# Patient Record
Sex: Male | Born: 1965 | Race: White | Hispanic: No | Marital: Single | Smoking: Current every day smoker
Health system: Southern US, Community
[De-identification: ages and names within clinical notes are randomized; demographics above are authoritative.]

## PROBLEM LIST (undated history)

## (undated) HISTORY — PX: VASECTOMY: SHX75

## (undated) HISTORY — PX: HERNIA REPAIR: SHX51

## (undated) HISTORY — PX: FRACTURE SURGERY: SHX138

---

## 2009-06-20 DEATH — deceased

## 2014-03-20 ENCOUNTER — Emergency Department (HOSPITAL_BASED_OUTPATIENT_CLINIC_OR_DEPARTMENT_OTHER)
Admission: EM | Admit: 2014-03-20 | Discharge: 2014-03-20 | Disposition: A | Discharge status: Home / Self Care | Attending: Emergency Medicine | Admitting: Emergency Medicine

## 2014-03-20 ENCOUNTER — Encounter (HOSPITAL_BASED_OUTPATIENT_CLINIC_OR_DEPARTMENT_OTHER): Payer: Self-pay | Admitting: *Deleted

## 2014-03-20 ENCOUNTER — Emergency Department (HOSPITAL_BASED_OUTPATIENT_CLINIC_OR_DEPARTMENT_OTHER)

## 2014-03-20 DIAGNOSIS — Y9289 Other specified places as the place of occurrence of the external cause: Secondary | ICD-10-CM | POA: Insufficient documentation

## 2014-03-20 DIAGNOSIS — Y990 Civilian activity done for income or pay: Secondary | ICD-10-CM | POA: Diagnosis not present

## 2014-03-20 DIAGNOSIS — Z720 Tobacco use: Secondary | ICD-10-CM | POA: Insufficient documentation

## 2014-03-20 DIAGNOSIS — Y9389 Activity, other specified: Secondary | ICD-10-CM | POA: Insufficient documentation

## 2014-03-20 DIAGNOSIS — W231XXA Caught, crushed, jammed, or pinched between stationary objects, initial encounter: Secondary | ICD-10-CM | POA: Insufficient documentation

## 2014-03-20 DIAGNOSIS — S99922A Unspecified injury of left foot, initial encounter: Secondary | ICD-10-CM | POA: Diagnosis present

## 2014-03-20 DIAGNOSIS — S93602A Unspecified sprain of left foot, initial encounter: Secondary | ICD-10-CM | POA: Diagnosis not present

## 2014-03-20 NOTE — ED Provider Notes (Signed)
CSN: [id number]     Arrival date & time 03/20/14  1217 History   First MD Initiated Contact with Patient 03/20/14 1239     Chief Complaint  Patient presents with  . Foot Injury     (Consider location/radiation/quality/duration/timing/severity/associated sxs/prior Treatment) HPI Comments: 49 year old male presenting to the ED complaining of left foot pain 1 week. Patient reports one week ago while he was at work his foot got compressed by a pallet jack. Patient states initially his foot was not very painful, however throughout the night into the next day his foot started to throb. Pain worse when he walks on the ball of his foot. 2 days later, he noticed across the top of his foot was bruised, with some swelling. The bruising and swelling went away, and then he noticed some redness across the top of his toes which made him come in to the ED today. He took Aleve the first night with some relief of his symptoms. Denies fever or chills. There have been no open wounds. Pt states he stepped on a sewing pin when he was 12 and it was never removed and may be seen on xray.  Patient is a 49 y.o. male presenting with foot injury. The history is provided by the patient.  Foot Injury Associated symptoms: no fever     History reviewed. No pertinent past medical history. Past Surgical History  Procedure Laterality Date  . Hernia repair    . Vasectomy    . Fracture surgery     No family history on file. History  Substance Use Topics  . Smoking status: Current Every Day Smoker -- 0.50 packs/day    Types: Cigarettes  . Smokeless tobacco: Not on file  . Alcohol Use: Yes    Review of Systems  Constitutional: Negative for fever.  Musculoskeletal:       + L foot pain and swelling.  Skin: Positive for color change.  Neurological: Negative for numbness.      Allergies  Review of patient's allergies indicates no known allergies.  Home Medications   Prior to Admission medications   Not on  File   BP 129/74 mmHg  Pulse 92  Temp(Src) 97.9 F (36.6 C) (Oral)  Resp 18  Ht  (1.778 m)  Wt 167 lb (75.751 kg)  BMI 23.96 kg/m2  SpO2 98% Physical Exam  Constitutional: He is oriented to person, place, and time. He appears well-developed and well-nourished. No distress.  HENT:  Head: Normocephalic and atraumatic.  Eyes: Conjunctivae and EOM are normal.  Neck: Normal range of motion. Neck supple.  Cardiovascular: Normal rate, regular rhythm and normal heart sounds.   Pulmonary/Chest: Effort normal and breath sounds normal.  Musculoskeletal: Normal range of motion.  L foot- Tender to palpation over proximal third through fifth metatarsals. No swelling. Small area of redness over second MTP. No swelling, tenderness or warmth. Normal range of motion of his foot and ankle. +2 PT/DP pulse.  Neurological: He is alert and oriented to person, place, and time.  Skin: Skin is warm and dry.  Psychiatric: He has a normal mood and affect. His behavior is normal.  Nursing note and vitals reviewed.   ED Course  Procedures (including critical care time) Labs Review Labs Reviewed - No data to display  Imaging Review Dg Foot Complete Left  03/20/2014   CLINICAL DATA:  Acute left foot pain after getting stuck between a pallet at work 1 week ago. Initial encounter.  EXAM: LEFT FOOT -  COMPLETE 3+ VIEW  COMPARISON:  None.  FINDINGS: There is no evidence of fracture or dislocation. Surgical screw is noted in the calcaneus. Joint spaces are intact. Soft tissues are unremarkable.  IMPRESSION: No acute abnormality seen in the left foot.   Electronically Signed   By: Roque LiasJames  Green M.D.   On: 03/20/2014 13:39     EKG Interpretation None      MDM   Final diagnoses:  Foot sprain, left, initial encounter   Patient presenting with left foot pain after injury. Neurovascularly intact. No swelling or deformity. No acute findings on x-ray. Advised him to rest, ice and elevate his foot. NSAIDs for  pain. Stable for discharge. Return precautions given. Patient states understanding of treatment care plan and is agreeable.   Kathrynn SpeedRobyn M Oiva Dibari, PA-C 03/20/14 [id number]  Doaa Kendzierski M Teshaun Olarte, PA-C 03/20/14 1351  Mirian MoMatthew Gentry, MD 03/24/14 [phone]

## 2014-03-20 NOTE — ED Notes (Signed)
Left foot injury a week ago. His foot was compressed by a pallet jack. It was work related. He is contacting his work to ask if UDS is required.

## 2014-03-20 NOTE — Discharge Instructions (Signed)
Foot Sprain The muscles and cord like structures which attach muscle to bone (tendons) that surround the feet are made up of units. A foot sprain can occur at the weakest spot in any of these units. This condition is most often caused by injury to or overuse of the foot, as from playing contact sports, or aggravating a previous injury, or from poor conditioning, or obesity. SYMPTOMS  Pain with movement of the foot.  Tenderness and swelling at the injury site.  Loss of strength is present in moderate or severe sprains. THE THREE GRADES OR SEVERITY OF FOOT SPRAIN ARE:  Mild (Grade I): Slightly pulled muscle without tearing of muscle or tendon fibers or loss of strength.  Moderate (Grade II): Tearing of fibers in a muscle, tendon, or at the attachment to bone, with small decrease in strength.  Severe (Grade III): Rupture of the muscle-tendon-bone attachment, with separation of fibers. Severe sprain requires surgical repair. Often repeating (chronic) sprains are caused by overuse. Sudden (acute) sprains are caused by direct injury or over-use. DIAGNOSIS  Diagnosis of this condition is usually by your own observation. If problems continue, a caregiver may be required for further evaluation and treatment. X-rays may be required to make sure there are not breaks in the bones (fractures) present. Continued problems may require physical therapy for treatment. PREVENTION  Use strength and conditioning exercises appropriate for your sport.  Warm up properly prior to working out.  Use athletic shoes that are made for the sport you are participating in.  Allow adequate time for healing. Early return to activities makes repeat injury more likely, and can lead to an unstable arthritic foot that can result in prolonged disability. Mild sprains generally heal in 3 to 10 days, with moderate and severe sprains taking 2 to 10 weeks. Your caregiver can help you determine the proper time required for  healing. HOME CARE INSTRUCTIONS   Apply ice to the injury for 15-20 minutes, 03-04 times per day. Put the ice in a plastic bag and place a towel between the bag of ice and your skin.  An elastic wrap (like an Ace bandage) may be used to keep swelling down.  Keep foot above the level of the heart, or at least raised on a footstool, when swelling and pain are present.  Try to avoid use other than gentle range of motion while the foot is painful. Do not resume use until instructed by your caregiver. Then begin use gradually, not increasing use to the point of pain. If pain does develop, decrease use and continue the above measures, gradually increasing activities that do not cause discomfort, until you gradually achieve normal use.  Use crutches if and as instructed, and for the length of time instructed.  Keep injured foot and ankle wrapped between treatments.  Massage foot and ankle for comfort and to keep swelling down. Massage from the toes up towards the knee.  Only take over-the-counter or prescription medicines for pain, discomfort, or fever as directed by your caregiver. SEEK IMMEDIATE MEDICAL CARE IF:   Your pain and swelling increase, or pain is not controlled with medications.  You have loss of feeling in your foot or your foot turns cold or blue.  You develop new, unexplained symptoms, or an increase of the symptoms that brought you to your caregiver. MAKE SURE YOU:   Understand these instructions.  Will watch your condition.  Will get help right away if you are not doing well or get worse. Document Released:   07/29/2001 Document Revised: 05/01/2011 Document Reviewed: 09/26/2007 [employer] Patient Information 2015 [employer]. This information is not intended to replace advice given to you by your health care provider. Make sure you discuss any questions you have with your health care provider.  RICE: Routine Care for Injuries The routine care of many injuries includes  Rest, Ice, Compression, and Elevation (RICE). HOME CARE INSTRUCTIONS  Rest is needed to allow your body to heal. Routine activities can usually be resumed when comfortable. Injured tendons and bones can take up to 6 weeks to heal. Tendons are the cord-like structures that attach muscle to bone.  Ice following an injury helps keep the swelling down and reduces pain.  Put ice in a plastic bag.  Place a towel between your skin and the bag.  Leave the ice on for 15-20 minutes, 3-4 times a day, or as directed by your health care provider. Do this while awake, for the first 24 to 48 hours. After that, continue as directed by your caregiver.  Compression helps keep swelling down. It also gives support and helps with discomfort. If an elastic bandage has been applied, it should be removed and reapplied every 3 to 4 hours. It should not be applied tightly, but firmly enough to keep swelling down. Watch fingers or toes for swelling, bluish discoloration, coldness, numbness, or excessive pain. If any of these problems occur, remove the bandage and reapply loosely. Contact your caregiver if these problems continue.  Elevation helps reduce swelling and decreases pain. With extremities, such as the arms, hands, legs, and feet, the injured area should be placed near or above the level of the heart, if possible. SEEK IMMEDIATE MEDICAL CARE IF:  You have persistent pain and swelling.  You develop redness, numbness, or unexpected weakness.  Your symptoms are getting worse rather than improving after several days. These symptoms may indicate that further evaluation or further X-rays are needed. Sometimes, X-rays may not show a small broken bone (fracture) until 1 week or 10 days later. Make a follow-up appointment with your caregiver. Ask when your X-ray results will be ready. Make sure you get your X-ray results. Document Released: 05/21/2000 Document Revised: 02/11/2013 Document Reviewed:  07/08/2010 [employer] Patient Information 2015 [employer]. This information is not intended to replace advice given to you by your health care provider. Make sure you discuss any questions you have with your health care provider.  

## 2015-03-30 ENCOUNTER — Encounter (HOSPITAL_BASED_OUTPATIENT_CLINIC_OR_DEPARTMENT_OTHER): Payer: Self-pay | Admitting: Emergency Medicine

## 2015-03-30 ENCOUNTER — Emergency Department (HOSPITAL_BASED_OUTPATIENT_CLINIC_OR_DEPARTMENT_OTHER)

## 2015-03-30 ENCOUNTER — Emergency Department (HOSPITAL_BASED_OUTPATIENT_CLINIC_OR_DEPARTMENT_OTHER)
Admission: EM | Admit: 2015-03-30 | Discharge: 2015-03-30 | Disposition: A | Discharge status: Home / Self Care | Attending: Emergency Medicine | Admitting: Emergency Medicine

## 2015-03-30 DIAGNOSIS — S86112A Strain of other muscle(s) and tendon(s) of posterior muscle group at lower leg level, left leg, initial encounter: Secondary | ICD-10-CM | POA: Diagnosis not present

## 2015-03-30 DIAGNOSIS — F17210 Nicotine dependence, cigarettes, uncomplicated: Secondary | ICD-10-CM | POA: Insufficient documentation

## 2015-03-30 DIAGNOSIS — Y9289 Other specified places as the place of occurrence of the external cause: Secondary | ICD-10-CM | POA: Insufficient documentation

## 2015-03-30 DIAGNOSIS — Z791 Long term (current) use of non-steroidal anti-inflammatories (NSAID): Secondary | ICD-10-CM | POA: Insufficient documentation

## 2015-03-30 DIAGNOSIS — W07XXXA Fall from chair, initial encounter: Secondary | ICD-10-CM | POA: Diagnosis not present

## 2015-03-30 DIAGNOSIS — Y9389 Activity, other specified: Secondary | ICD-10-CM | POA: Insufficient documentation

## 2015-03-30 DIAGNOSIS — Y990 Civilian activity done for income or pay: Secondary | ICD-10-CM | POA: Diagnosis not present

## 2015-03-30 DIAGNOSIS — S8992XA Unspecified injury of left lower leg, initial encounter: Secondary | ICD-10-CM | POA: Diagnosis present

## 2015-03-30 MED ORDER — IBUPROFEN 800 MG PO TABS: 800.0000 mg | ORAL_TABLET | Freq: Three times a day (TID) | ORAL | Status: AC | PRN

## 2015-03-30 MED FILL — IBUPROFEN 800 MG TABLET: 800 | 7 days supply | Qty: 21 | Fill #0

## 2015-03-30 NOTE — Discharge Instructions (Signed)
Ice and elevate the area, along with alternating heat.  Follow-up with the orthopedist provided.  Your x-rays did not show any significant abnormality

## 2015-03-30 NOTE — ED Notes (Signed)
Pt was attempting to sit down at work in a metal chair that was broken, pt was unaware of this, when he felt hisself falling to ground, he states he flexed his legs and felt immediate pain in left knee

## 2015-03-30 NOTE — ED Provider Notes (Signed)
CSN: [id number]     Arrival date & time 03/30/15  1032 History   First MD Initiated Contact with Patient 03/30/15 1057     Chief Complaint  Patient presents with  . Knee Injury     (Consider location/radiation/quality/duration/timing/severity/associated sxs/prior Treatment) HPI Patient presents to the emergency department with left lower leg injury following a fall.  Patient states that he was at work when he was sitting on a metal chair that was broken and he states that he felt himself falling and attempt catches himself by flexing his legs and he felt immediate pain in the posterior calf region.  The nursing note states that it was knee, but he states to me that it was calf.  Patient denies numbness, weakness, gait disturbance, back pain, or syncope.  The patient states that movement and palpation make the pain worse.  Patient did not take any medications prior to arrival History reviewed. No pertinent past medical history. Past Surgical History  Procedure Laterality Date  . Hernia repair    . Vasectomy    . Fracture surgery     History reviewed. No pertinent family history. Social History  Substance Use Topics  . Smoking status: Current Every Day Smoker -- 0.50 packs/day    Types: Cigarettes  . Smokeless tobacco: None  . Alcohol Use: Yes    Review of Systems All other systems negative except as documented in the HPI. All pertinent positives and negatives as reviewed in the HPI.  Allergies  Review of patient's allergies indicates no known allergies.  Home Medications   Prior to Admission medications   Medication Sig Start Date End Date Taking? Authorizing Provider  ibuprofen (ADVIL,MOTRIN) 200 MG tablet Take 200 mg by mouth every 6 (six) hours as needed.   Yes Historical Provider, MD  naproxen (NAPROSYN) 250 MG tablet Take 500 mg by mouth 2 (two) times daily with a meal.   Yes Historical Provider, MD   BP 121/82 mmHg  Pulse 99  Temp(Src) 98.1 F (36.7 C) (Oral)  Resp 18   Ht  (1.778 m)  Wt 77.111 kg  BMI 24.39 kg/m2  SpO2 100% Physical Exam  Constitutional: He is oriented to person, place, and time. He appears well-developed and well-nourished. No distress.  HENT:  Head: Normocephalic and atraumatic.  Eyes: Pupils are equal, round, and reactive to light.  Pulmonary/Chest: Effort normal.  Musculoskeletal:       Left knee: He exhibits normal range of motion, no swelling, no effusion, no ecchymosis, no deformity, no erythema, no LCL laxity, normal patellar mobility, no bony tenderness, normal meniscus and no MCL laxity. No tenderness found.       Legs: Neurological: He is alert and oriented to person, place, and time.  Skin: Skin is warm and dry. No rash noted. No erythema.    ED Course  Procedures (including critical care time) Labs Review Labs Reviewed - No data to display  Imaging Review Dg Knee Complete 4 Views Left  03/30/2015  CLINICAL DATA:  50 year old male who stat on broken chair and fell. Acute pain in the left knee. Initial encounter. EXAM: LEFT KNEE - COMPLETE 4+ VIEW COMPARISON:  None. FINDINGS: No joint effusion identified. Bone mineralization is within normal limits. Patella intact. Mild medial compartment joint space loss, other joint spaces appear preserved. No acute osseous abnormality identified. IMPRESSION: Mild medial compartment joint degeneration. No acute osseous abnormality identified at the left knee. Electronically Signed   By: Odessa Fleming M.D.   On:  03/30/2015 12:10   I have personally reviewed and evaluated these images and lab results as part of my medical decision-making.   Feel that the patient's injury is most likely isolate to the gastrocs, although a knee injury, cannot be totally excluded.  The x-rays did not show any abnormalities.  He has full range of motion of the knee without significant abnormality on examination.  Based on examination.  I feel this is more of a muscular injury to the gastrocs rather than to the  knee.  Will have a follow-up with orthopedics for further evaluation.  Told ice and elevate the area with heat as well  Charlestine Night, PA-C 03/30/15 [address] PA-C 03/30/15 1235  Tilden Fossa, MD 03/31/15 [phone]

## 2016-02-06 IMAGING — CR DG FOOT COMPLETE 3+V*L*
3 series · 3 of 3 positions shown · non-contrast
Comparison: None.

CLINICAL DATA: Acute left foot pain after getting stuck between a
pallet at work 1 week ago. Initial encounter.

EXAM:
LEFT FOOT - COMPLETE 3+ VIEW

[t foot ap left]
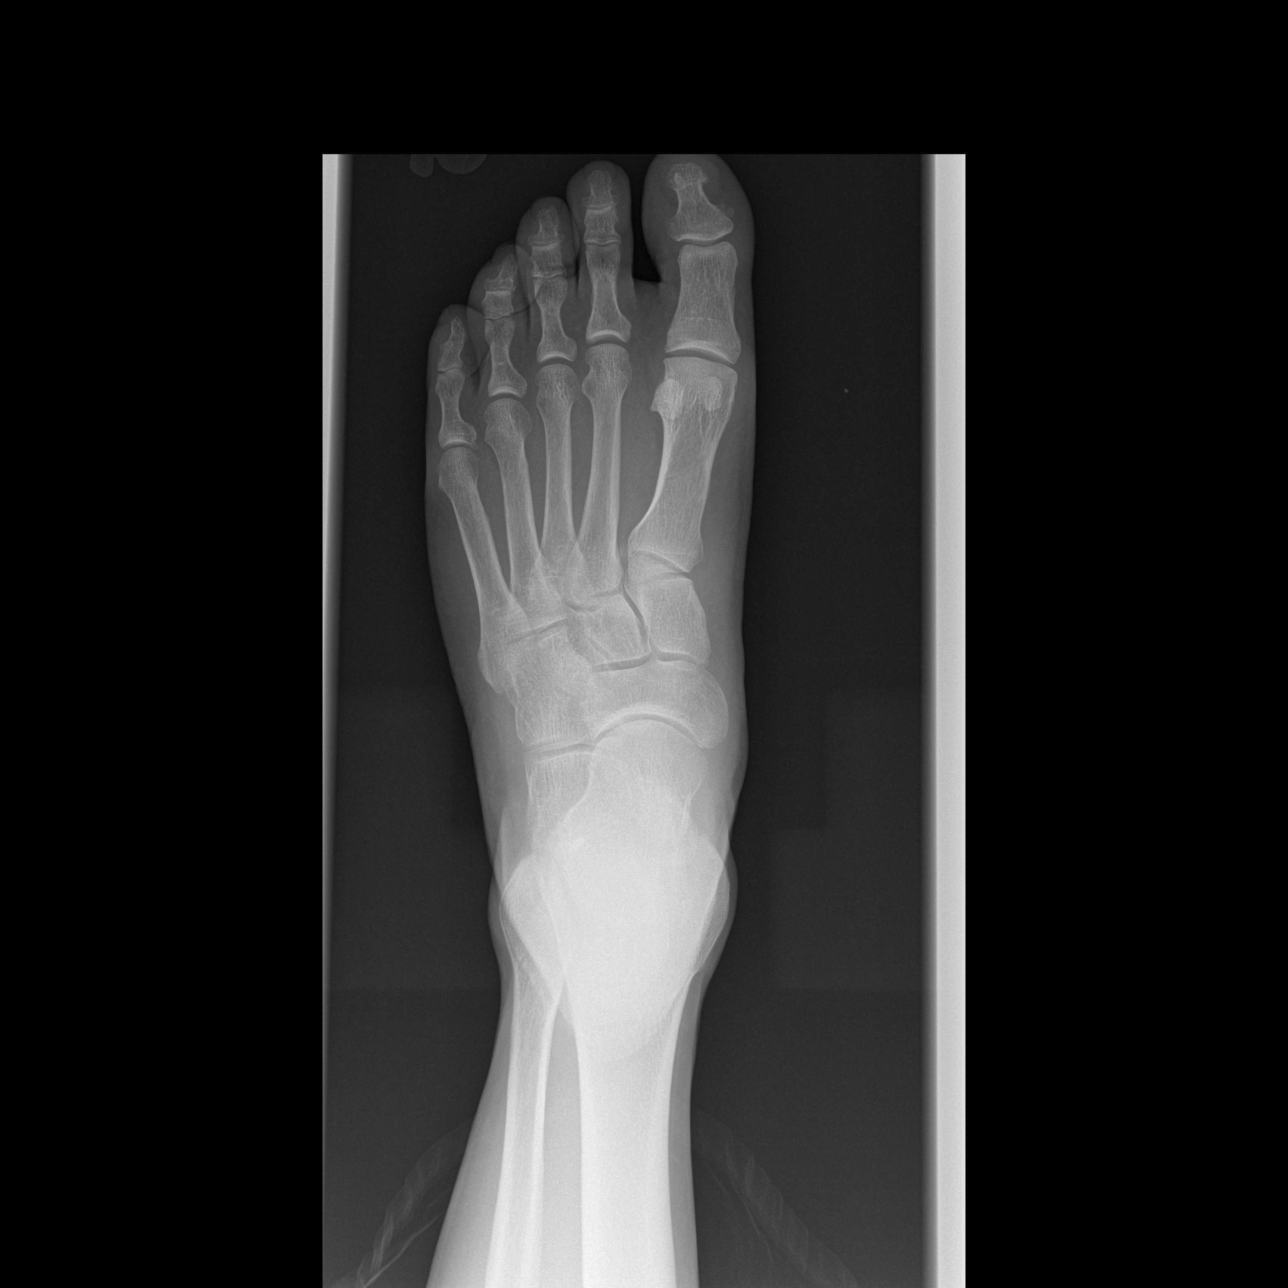

[t foot oblique left]
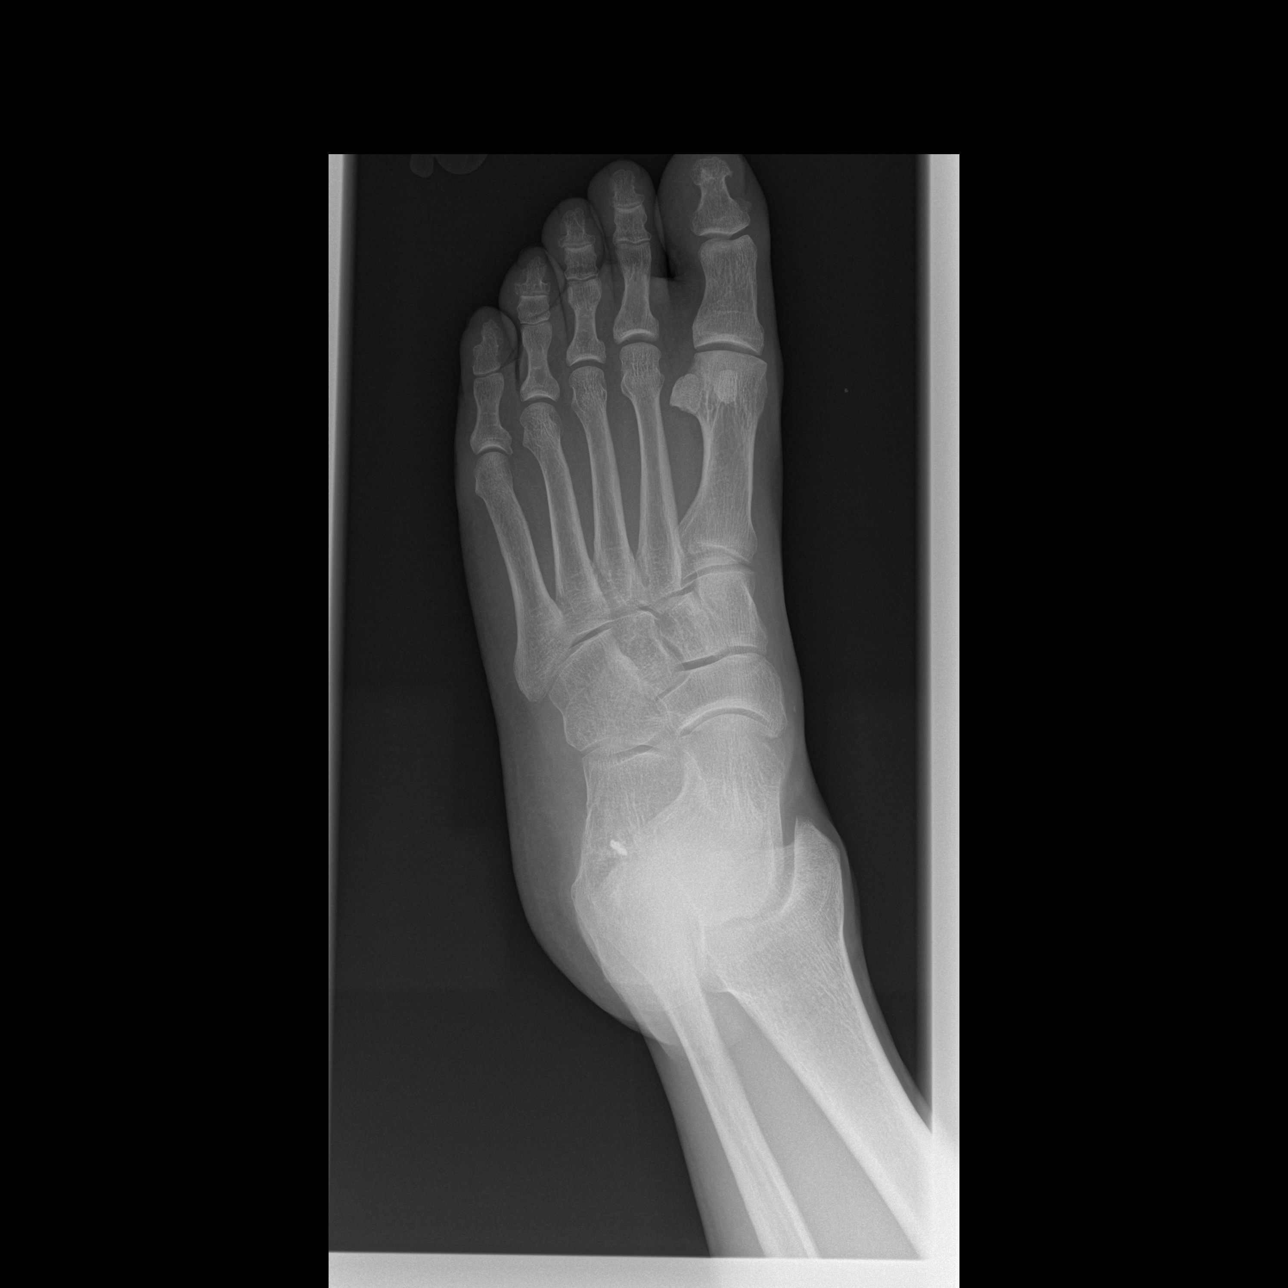

[t foot lat left]
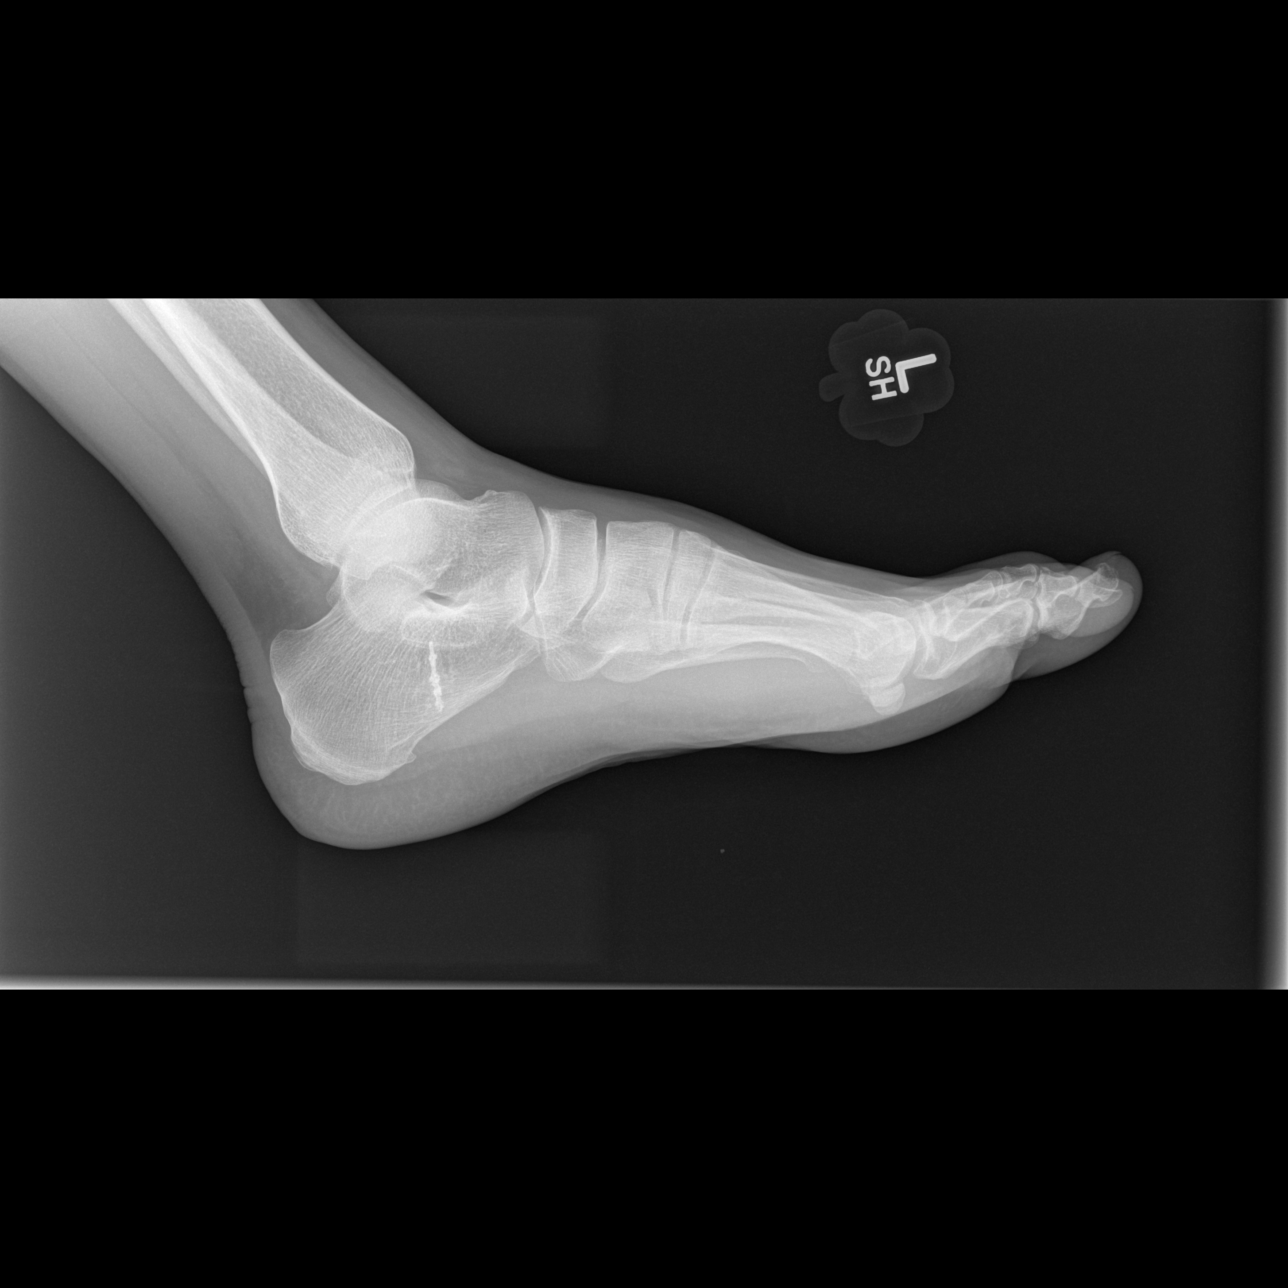

[3 of 3 positions shown; findings below may reference images not displayed]

FINDINGS: There is no evidence of fracture or dislocation. Surgical screw is
noted in the calcaneus. Joint spaces are intact. Soft tissues are
unremarkable.
IMPRESSION: No acute abnormality seen in the left foot.
